# Patient Record
Sex: Female | Born: 1950 | Hispanic: No | Marital: Married | Smoking: Never smoker
Health system: Southern US, Community
[De-identification: ages and names within clinical notes are randomized; demographics above are authoritative.]

---

## 2005-03-03 HISTORY — PX: BREAST BIOPSY: SHX20

## 2005-03-21 ENCOUNTER — Other Ambulatory Visit: Admission: RE | Admit: 2005-03-21 | Discharge: 2005-03-21 | Payer: Self-pay | Admitting: Family Medicine

## 2005-04-09 ENCOUNTER — Encounter: Admission: RE | Admit: 2005-04-09 | Discharge: 2005-04-09 | Payer: Self-pay | Admitting: Family Medicine

## 2005-06-09 ENCOUNTER — Encounter: Admission: RE | Admit: 2005-06-09 | Discharge: 2005-06-09 | Payer: Self-pay | Admitting: Family Medicine

## 2005-06-16 ENCOUNTER — Encounter (INDEPENDENT_AMBULATORY_CARE_PROVIDER_SITE_OTHER): Payer: Self-pay | Admitting: *Deleted

## 2005-06-16 ENCOUNTER — Encounter: Admission: RE | Admit: 2005-06-16 | Discharge: 2005-06-16 | Payer: Self-pay | Admitting: Family Medicine

## 2006-06-24 ENCOUNTER — Encounter: Admission: RE | Admit: 2006-06-24 | Discharge: 2006-06-24 | Payer: Self-pay | Admitting: Internal Medicine

## 2007-06-28 ENCOUNTER — Encounter: Admission: RE | Admit: 2007-06-28 | Discharge: 2007-06-28 | Payer: Self-pay | Admitting: Family Medicine

## 2008-07-05 ENCOUNTER — Encounter: Admission: RE | Admit: 2008-07-05 | Discharge: 2008-07-05 | Payer: Self-pay | Admitting: Family Medicine

## 2009-07-10 ENCOUNTER — Encounter: Admission: RE | Admit: 2009-07-10 | Discharge: 2009-07-10 | Payer: Self-pay | Admitting: Family Medicine

## 2010-03-24 ENCOUNTER — Encounter: Payer: Self-pay | Admitting: Family Medicine

## 2010-07-25 ENCOUNTER — Other Ambulatory Visit (HOSPITAL_BASED_OUTPATIENT_CLINIC_OR_DEPARTMENT_OTHER): Payer: Self-pay | Admitting: Family Medicine

## 2010-07-25 DIAGNOSIS — Z1231 Encounter for screening mammogram for malignant neoplasm of breast: Secondary | ICD-10-CM

## 2010-07-31 ENCOUNTER — Ambulatory Visit (HOSPITAL_BASED_OUTPATIENT_CLINIC_OR_DEPARTMENT_OTHER)
Admission: RE | Admit: 2010-07-31 | Discharge: 2010-07-31 | Disposition: A | Discharge status: Home / Self Care | Payer: BC Managed Care – PPO | Source: Ambulatory Visit | Attending: Family Medicine | Admitting: Family Medicine

## 2010-07-31 DIAGNOSIS — Z1231 Encounter for screening mammogram for malignant neoplasm of breast: Secondary | ICD-10-CM | POA: Insufficient documentation

## 2011-07-21 ENCOUNTER — Other Ambulatory Visit: Payer: Self-pay | Admitting: Family Medicine

## 2011-07-21 DIAGNOSIS — Z1231 Encounter for screening mammogram for malignant neoplasm of breast: Secondary | ICD-10-CM

## 2011-08-05 ENCOUNTER — Ambulatory Visit
Admission: RE | Admit: 2011-08-05 | Discharge: 2011-08-05 | Disposition: A | Discharge status: Home / Self Care | Payer: BC Managed Care – PPO | Source: Ambulatory Visit | Attending: Family Medicine | Admitting: Family Medicine

## 2011-08-05 DIAGNOSIS — Z1231 Encounter for screening mammogram for malignant neoplasm of breast: Secondary | ICD-10-CM

## 2012-07-01 ENCOUNTER — Other Ambulatory Visit: Payer: Self-pay

## 2012-07-01 DIAGNOSIS — Z1231 Encounter for screening mammogram for malignant neoplasm of breast: Secondary | ICD-10-CM

## 2012-07-15 ENCOUNTER — Other Ambulatory Visit: Payer: Self-pay | Admitting: Family Medicine

## 2012-07-15 DIAGNOSIS — Z780 Asymptomatic menopausal state: Secondary | ICD-10-CM

## 2012-08-05 ENCOUNTER — Ambulatory Visit
Admission: RE | Admit: 2012-08-05 | Discharge: 2012-08-05 | Disposition: A | Discharge status: Home / Self Care | Payer: BC Managed Care – PPO | Source: Ambulatory Visit | Attending: Family Medicine | Admitting: Family Medicine

## 2012-08-05 ENCOUNTER — Ambulatory Visit
Admission: RE | Admit: 2012-08-05 | Discharge: 2012-08-05 | Disposition: A | Discharge status: Home / Self Care | Payer: BC Managed Care – PPO | Source: Ambulatory Visit

## 2012-08-05 DIAGNOSIS — Z1231 Encounter for screening mammogram for malignant neoplasm of breast: Secondary | ICD-10-CM

## 2012-08-05 DIAGNOSIS — Z780 Asymptomatic menopausal state: Secondary | ICD-10-CM

## 2012-08-20 ENCOUNTER — Ambulatory Visit: Payer: BC Managed Care – PPO

## 2013-06-27 ENCOUNTER — Other Ambulatory Visit (HOSPITAL_COMMUNITY)
Admission: RE | Admit: 2013-06-27 | Discharge: 2013-06-27 | Disposition: A | Discharge status: Home / Self Care | Payer: BC Managed Care – PPO | Source: Ambulatory Visit | Attending: Family Medicine | Admitting: Family Medicine

## 2013-06-27 ENCOUNTER — Other Ambulatory Visit: Payer: Self-pay | Admitting: Family Medicine

## 2013-06-27 DIAGNOSIS — Z124 Encounter for screening for malignant neoplasm of cervix: Secondary | ICD-10-CM | POA: Insufficient documentation

## 2013-06-27 DIAGNOSIS — Z1151 Encounter for screening for human papillomavirus (HPV): Secondary | ICD-10-CM | POA: Insufficient documentation

## 2013-08-05 ENCOUNTER — Other Ambulatory Visit: Payer: Self-pay

## 2013-08-05 DIAGNOSIS — Z1231 Encounter for screening mammogram for malignant neoplasm of breast: Secondary | ICD-10-CM

## 2013-08-09 ENCOUNTER — Ambulatory Visit
Admission: RE | Admit: 2013-08-09 | Discharge: 2013-08-09 | Disposition: A | Discharge status: Home / Self Care | Payer: BC Managed Care – PPO | Source: Ambulatory Visit

## 2013-08-09 ENCOUNTER — Encounter (INDEPENDENT_AMBULATORY_CARE_PROVIDER_SITE_OTHER): Payer: Self-pay

## 2013-08-09 DIAGNOSIS — Z1231 Encounter for screening mammogram for malignant neoplasm of breast: Secondary | ICD-10-CM

## 2014-07-04 ENCOUNTER — Other Ambulatory Visit: Payer: Self-pay

## 2014-07-04 DIAGNOSIS — Z1231 Encounter for screening mammogram for malignant neoplasm of breast: Secondary | ICD-10-CM

## 2014-08-18 ENCOUNTER — Ambulatory Visit: Payer: Self-pay

## 2014-08-22 ENCOUNTER — Ambulatory Visit
Admission: RE | Admit: 2014-08-22 | Discharge: 2014-08-22 | Disposition: A | Discharge status: Home / Self Care | Payer: BLUE CROSS/BLUE SHIELD | Source: Ambulatory Visit

## 2014-08-22 DIAGNOSIS — Z1231 Encounter for screening mammogram for malignant neoplasm of breast: Secondary | ICD-10-CM

## 2015-07-16 ENCOUNTER — Other Ambulatory Visit: Payer: Self-pay

## 2015-07-16 DIAGNOSIS — Z1231 Encounter for screening mammogram for malignant neoplasm of breast: Secondary | ICD-10-CM

## 2015-08-23 ENCOUNTER — Other Ambulatory Visit: Payer: Self-pay | Admitting: Family Medicine

## 2015-08-23 ENCOUNTER — Ambulatory Visit
Admission: RE | Admit: 2015-08-23 | Discharge: 2015-08-23 | Disposition: A | Discharge status: Home / Self Care | Payer: BLUE CROSS/BLUE SHIELD | Source: Ambulatory Visit

## 2015-08-23 DIAGNOSIS — Z1231 Encounter for screening mammogram for malignant neoplasm of breast: Secondary | ICD-10-CM

## 2015-08-27 ENCOUNTER — Other Ambulatory Visit: Payer: Self-pay | Admitting: Family Medicine

## 2015-08-27 DIAGNOSIS — R928 Other abnormal and inconclusive findings on diagnostic imaging of breast: Secondary | ICD-10-CM

## 2015-09-03 ENCOUNTER — Ambulatory Visit
Admission: RE | Admit: 2015-09-03 | Discharge: 2015-09-03 | Disposition: A | Discharge status: Home / Self Care | Payer: BLUE CROSS/BLUE SHIELD | Source: Ambulatory Visit | Attending: Family Medicine | Admitting: Family Medicine

## 2015-09-03 DIAGNOSIS — R928 Other abnormal and inconclusive findings on diagnostic imaging of breast: Secondary | ICD-10-CM

## 2016-01-31 ENCOUNTER — Other Ambulatory Visit: Payer: Self-pay | Admitting: Family Medicine

## 2016-01-31 DIAGNOSIS — N6310 Unspecified lump in the right breast, unspecified quadrant: Secondary | ICD-10-CM

## 2016-03-14 ENCOUNTER — Other Ambulatory Visit: Payer: BLUE CROSS/BLUE SHIELD

## 2016-03-19 ENCOUNTER — Other Ambulatory Visit: Payer: BLUE CROSS/BLUE SHIELD

## 2016-03-24 ENCOUNTER — Ambulatory Visit
Admission: RE | Admit: 2016-03-24 | Discharge: 2016-03-24 | Disposition: A | Discharge status: Home / Self Care | Payer: BLUE CROSS/BLUE SHIELD | Source: Ambulatory Visit | Attending: Family Medicine | Admitting: Family Medicine

## 2016-03-24 DIAGNOSIS — N6310 Unspecified lump in the right breast, unspecified quadrant: Secondary | ICD-10-CM

## 2016-07-17 ENCOUNTER — Other Ambulatory Visit: Payer: Self-pay | Admitting: Family Medicine

## 2016-07-17 ENCOUNTER — Other Ambulatory Visit (HOSPITAL_COMMUNITY)
Admission: RE | Admit: 2016-07-17 | Discharge: 2016-07-17 | Disposition: A | Discharge status: Home / Self Care | Payer: BLUE CROSS/BLUE SHIELD | Source: Ambulatory Visit | Attending: Family Medicine | Admitting: Family Medicine

## 2016-07-17 DIAGNOSIS — Z01411 Encounter for gynecological examination (general) (routine) with abnormal findings: Secondary | ICD-10-CM | POA: Diagnosis present

## 2016-07-18 LAB — CYTOLOGY - PAP: Diagnosis: NEGATIVE

## 2016-08-18 ENCOUNTER — Other Ambulatory Visit: Payer: Self-pay | Admitting: Family Medicine

## 2016-08-18 DIAGNOSIS — Z1231 Encounter for screening mammogram for malignant neoplasm of breast: Secondary | ICD-10-CM

## 2016-09-02 ENCOUNTER — Ambulatory Visit
Admission: RE | Admit: 2016-09-02 | Discharge: 2016-09-02 | Disposition: A | Discharge status: Home / Self Care | Payer: BLUE CROSS/BLUE SHIELD | Source: Ambulatory Visit | Attending: Family Medicine | Admitting: Family Medicine

## 2016-09-02 ENCOUNTER — Other Ambulatory Visit: Payer: Self-pay | Admitting: Family Medicine

## 2016-09-02 DIAGNOSIS — Z1231 Encounter for screening mammogram for malignant neoplasm of breast: Secondary | ICD-10-CM

## 2016-09-02 DIAGNOSIS — N6001 Solitary cyst of right breast: Secondary | ICD-10-CM

## 2016-09-10 ENCOUNTER — Ambulatory Visit
Admission: RE | Admit: 2016-09-10 | Discharge: 2016-09-10 | Disposition: A | Discharge status: Home / Self Care | Payer: BLUE CROSS/BLUE SHIELD | Source: Ambulatory Visit | Attending: Family Medicine | Admitting: Family Medicine

## 2016-09-10 ENCOUNTER — Other Ambulatory Visit: Payer: Self-pay | Admitting: Family Medicine

## 2016-09-10 DIAGNOSIS — N6001 Solitary cyst of right breast: Secondary | ICD-10-CM

## 2017-08-07 ENCOUNTER — Other Ambulatory Visit: Payer: Self-pay | Admitting: Family Medicine

## 2017-08-07 DIAGNOSIS — Z1231 Encounter for screening mammogram for malignant neoplasm of breast: Secondary | ICD-10-CM

## 2017-09-11 ENCOUNTER — Ambulatory Visit
Admission: RE | Admit: 2017-09-11 | Discharge: 2017-09-11 | Disposition: A | Discharge status: Home / Self Care | Payer: BLUE CROSS/BLUE SHIELD | Source: Ambulatory Visit | Attending: Family Medicine | Admitting: Family Medicine

## 2017-09-11 DIAGNOSIS — Z1231 Encounter for screening mammogram for malignant neoplasm of breast: Secondary | ICD-10-CM

## 2018-07-12 ENCOUNTER — Emergency Department (HOSPITAL_COMMUNITY)
Admission: EM | Admit: 2018-07-12 | Discharge: 2018-07-12 | Disposition: A | Discharge status: Home / Self Care | Payer: Medicare HMO | Attending: Emergency Medicine | Admitting: Emergency Medicine

## 2018-07-12 ENCOUNTER — Emergency Department (HOSPITAL_COMMUNITY): Payer: Medicare HMO

## 2018-07-12 ENCOUNTER — Encounter (HOSPITAL_COMMUNITY): Payer: Self-pay | Admitting: Emergency Medicine

## 2018-07-12 ENCOUNTER — Other Ambulatory Visit: Payer: Self-pay

## 2018-07-12 DIAGNOSIS — R531 Weakness: Secondary | ICD-10-CM | POA: Diagnosis not present

## 2018-07-12 DIAGNOSIS — R5383 Other fatigue: Secondary | ICD-10-CM | POA: Diagnosis not present

## 2018-07-12 DIAGNOSIS — M7918 Myalgia, other site: Secondary | ICD-10-CM | POA: Diagnosis not present

## 2018-07-12 DIAGNOSIS — I10 Essential (primary) hypertension: Secondary | ICD-10-CM | POA: Diagnosis not present

## 2018-07-12 DIAGNOSIS — R110 Nausea: Secondary | ICD-10-CM

## 2018-07-12 DIAGNOSIS — R05 Cough: Secondary | ICD-10-CM | POA: Insufficient documentation

## 2018-07-12 LAB — BASIC METABOLIC PANEL
Anion gap: 13 (ref 5–15)
BUN: 16 mg/dL (ref 8–23)
CO2: 22 mmol/L (ref 22–32)
Calcium: 9.1 mg/dL (ref 8.9–10.3)
Chloride: 100 mmol/L (ref 98–111)
Creatinine, Ser: 1.04 mg/dL — ABNORMAL HIGH (ref 0.44–1.00)
GFR calc Af Amer: >60 mL/min (ref >60)
GFR calc non Af Amer: 56 mL/min — ABNORMAL LOW (ref >60)
Glucose, Bld: 150 mg/dL — ABNORMAL HIGH (ref 70–99)
Potassium: 6 mmol/L — ABNORMAL HIGH (ref 3.5–5.1)
Sodium: 135 mmol/L (ref 135–145)

## 2018-07-12 LAB — CBC
HCT: 41.8 % (ref 36.0–46.0)
Hemoglobin: 14 g/dL (ref 12.0–15.0)
MCH: 31 pg (ref 26.0–34.0)
MCHC: 33.5 g/dL (ref 30.0–36.0)
MCV: 92.7 fL (ref 80.0–100.0)
Platelets: 400 10*3/uL (ref 150–400)
RBC: 4.51 MIL/uL (ref 3.87–5.11)
RDW: 12.3 % (ref 11.5–15.5)
WBC: 11 10*3/uL — ABNORMAL HIGH (ref 4.0–10.5)
nRBC: 0 % (ref 0.0–0.2)

## 2018-07-12 LAB — T4, FREE: Free T4: 1.24 ng/dL (ref 0.82–1.77)

## 2018-07-12 LAB — COMPREHENSIVE METABOLIC PANEL
ALT: 75 U/L — ABNORMAL HIGH (ref 0–44)
AST: 98 U/L — ABNORMAL HIGH (ref 15–41)
Albumin: 2.5 g/dL — ABNORMAL LOW (ref 3.5–5.0)
Alkaline Phosphatase: 68 U/L (ref 38–126)
Anion gap: 11 (ref 5–15)
BUN: 14 mg/dL (ref 8–23)
CO2: 22 mmol/L (ref 22–32)
Calcium: 8.1 mg/dL — ABNORMAL LOW (ref 8.9–10.3)
Chloride: 103 mmol/L (ref 98–111)
Creatinine, Ser: 0.93 mg/dL (ref 0.44–1.00)
GFR calc Af Amer: >60 mL/min (ref >60)
GFR calc non Af Amer: >60 mL/min (ref >60)
Glucose, Bld: 137 mg/dL — ABNORMAL HIGH (ref 70–99)
Potassium: 4.5 mmol/L (ref 3.5–5.1)
Sodium: 136 mmol/L (ref 135–145)
Total Bilirubin: 0.7 mg/dL (ref 0.3–1.2)
Total Protein: 6.3 g/dL — ABNORMAL LOW (ref 6.5–8.1)

## 2018-07-12 LAB — TSH: TSH: 0.696 u[IU]/mL (ref 0.350–4.500)

## 2018-07-12 MED ORDER — ONDANSETRON 4 MG PO TBDP
4.0000 mg | ORAL_TABLET | Freq: Once | ORAL | Status: AC
  Administered 2018-07-12: 4 mg via ORAL
  Filled 2018-07-12: qty 1

## 2018-07-12 MED ORDER — ONDANSETRON HCL 4 MG PO TABS: 4.0000 mg | ORAL_TABLET | Freq: Three times a day (TID) | ORAL | 0 refills | Status: AC | PRN

## 2018-07-12 MED ORDER — SODIUM CHLORIDE 0.9 % IV BOLUS
1000.0000 mL | Freq: Once | INTRAVENOUS | Status: AC
  Administered 2018-07-12: 1000 mL via INTRAVENOUS

## 2018-07-12 NOTE — ED Provider Notes (Signed)
I saw and evaluated the patient, reviewed the resident's note and I agree with the findings and plan with the following exceptions.   Patient is here with nonspecific symptoms of generalized weakness and nausea without vomiting.  No pain.  States is been going on for a few days now.  No fevers.  No history of the same. On my exam her abdomen is benign, vital signs within normal limits, normal skin tone, lungs are clear and heart regular rate and rhythm.  She has active bowel sounds. Unclear etiology for symptoms her potassium seems to be hemolyzed we will recheck that.  Will add on EKG TSH give her some fluids and some nausea medicine and see if she feels better.  Agree with check an EKG to make sure is not cardiac related.  Could consider reflux however she does not have any burning or chest pain to corroborate that.  Consider gallbladder but if her liver enzymes and bilirubin are normal I think is less likely.  Could be just a viral illness that she is getting over as well but she does not appear acutely ill I think she will likely be able be discharged.   EKG Interpretation  Date/Time:    Ventricular Rate:    PR Interval:    QRS Duration:   QT Interval:    QTC Calculation:   R Axis:     Text Interpretation:           Marily Memos, MD 07/12/18 1919

## 2018-07-12 NOTE — ED Notes (Signed)
Call Rico Ala for updates please [phone]

## 2018-07-12 NOTE — ED Provider Notes (Signed)
[facility] - [facility] Provider Note   CSN: [id number] Arrival date & time: 07/12/18  1335    History   Chief Complaint Chief Complaint  Patient presents with   Weakness   Emesis   Generalized Body Aches    HPI Tara Davidson is a 68 y.o. female with a PMHx of HTN, HLD who presents to the ED with fatigue and not feeling well for the past week. States her husband came down sick 11 days ago and was hospitalized for his ESRD and then developed a fever while in the hospital (he is still hospitalized).  States shortly after she started not feeling well and has had a decreased appetite. Also endorsing a cough and nausea. Denies any Fevers, SOB, CP, Abdominal pain, urinary symptoms, diarrhea or constipation      HPI  History reviewed. No pertinent past medical history.  There are no active problems to display for this patient.   History reviewed. No pertinent surgical history.   OB History   No obstetric history on file.      Home Medications    Prior to Admission medications   Medication Sig Start Date End Date Taking? Authorizing Provider  ondansetron (ZOFRAN) 4 MG tablet Take 1 tablet (4 mg total) by mouth every 8 (eight) hours as needed for nausea or vomiting. 07/12/18   Tara Doe, MD    Family History Family History  Problem Relation Age of Onset   Breast cancer Neg Hx     Social History Social History   Tobacco Use   Smoking status: Never Smoker   Smokeless tobacco: Never Used  Substance Use Topics   Alcohol use: Never    Frequency: Never   Drug use: Never     Allergies   Patient has no known allergies.   Review of Systems Review of Systems  Constitutional: Positive for activity change, appetite change and fatigue. Negative for chills, diaphoresis and fever.  Respiratory: Positive for cough. Negative for chest tightness and shortness of breath.   Cardiovascular: Negative for chest pain.    Gastrointestinal: Positive for nausea. Negative for abdominal pain, constipation, diarrhea and vomiting.  Genitourinary: Negative for difficulty urinating and dysuria.  Musculoskeletal: Negative for back pain and neck pain.  Skin: Negative for rash and wound.  Neurological: Positive for weakness (generalized). Negative for dizziness, seizures, facial asymmetry, speech difficulty, light-headedness, numbness and headaches.  Psychiatric/Behavioral: Negative for confusion.  All other systems reviewed and are negative.    Physical Exam Updated Vital Signs BP (!) 118/97    Pulse 80    Temp 98.6 F (37 C) (Oral)    Resp 16    Wt 52.6 kg    SpO2 97%   Physical Exam Vitals signs and nursing note reviewed.  Constitutional:      General: She is not in acute distress.    Appearance: Normal appearance. She is well-developed.  HENT:     Head: Normocephalic and atraumatic.  Eyes:     Conjunctiva/sclera: Conjunctivae normal.  Neck:     Musculoskeletal: Neck supple.  Cardiovascular:     Rate and Rhythm: Normal rate and regular rhythm.     Heart sounds: No murmur.  Pulmonary:     Effort: Pulmonary effort is normal. No respiratory distress.     Breath sounds: Normal breath sounds. No wheezing, rhonchi or rales.  Abdominal:     Palpations: Abdomen is soft.     Tenderness: There is no abdominal tenderness. There is no  guarding.  Musculoskeletal:     Right lower leg: No edema.     Left lower leg: No edema.  Skin:    General: Skin is warm and dry.  Neurological:     General: No focal deficit present.     Mental Status: She is alert and oriented to person, place, and time.     Cranial Nerves: No cranial nerve deficit.     Sensory: No sensory deficit.     Motor: No weakness.      ED Treatments / Results  Labs (all labs ordered are listed, but only abnormal results are displayed) Labs Reviewed  BASIC METABOLIC PANEL - Abnormal; Notable for the following components:      Result Value    Potassium 6.0 (*)    Glucose, Bld 150 (*)    Creatinine, Ser 1.04 (*)    GFR calc non Af Amer 56 (*)    All other components within normal limits  CBC - Abnormal; Notable for the following components:   WBC 11.0 (*)    All other components within normal limits  COMPREHENSIVE METABOLIC PANEL - Abnormal; Notable for the following components:   Glucose, Bld 137 (*)    Calcium 8.1 (*)    Total Protein 6.3 (*)    Albumin 2.5 (*)    AST 98 (*)    ALT 75 (*)    All other components within normal limits  TSH  T4, FREE    EKG None  Radiology Dg Chest Portable 1 View  Result Date: 07/12/2018 CLINICAL DATA:  Cough.  Intermittent fever. EXAM: PORTABLE CHEST 1 VIEW COMPARISON:  None. FINDINGS: The heart size and pulmonary vascularity are normal. Aortic atherosclerosis. Small areas of atelectasis at both lung bases. No consolidative infiltrates or effusions. No bone abnormality. IMPRESSION: Tiny areas of atelectasis at both lung bases. Aortic Atherosclerosis (ICD10-I70.0). Electronically Signed   By: Francene BoyersJames  Maxwell M.D.   On: 07/12/2018 15:38    Procedures Procedures (including critical care time)  Medications Ordered in ED Medications  ondansetron (ZOFRAN-ODT) disintegrating tablet 4 mg (4 mg Oral Given 07/12/18 1552)  sodium chloride 0.9 % bolus 1,000 mL (0 mLs Intravenous Stopped 07/12/18 1822)     Initial Impression / Assessment and Plan / ED Course  I have reviewed the triage vital signs and the nursing notes.  Pertinent labs & imaging results that were available during my care of the patient were reviewed by me and considered in my medical decision making (see chart for details).        Andi HenceMaria Lourdes Davidson is a 68 y.o. female with a PMHx of HTN, HLD who presents to the ED with fatigue and not feeling well for the past week. States her husband came down sick 11 days ago and was hospitalized for his ESRD and then developed a fever while in the hospital (he is still  hospitalized).  States shortly after she started not feeling well and has had a decreased appetite. Also endorsing a cough and nausea. Denies any Fevers, SOB, CP, Abdominal pain, urinary symptoms, diarrhea or constipation    On initial exam patient is well appearing although appears uncomfortable, not in acute distress. VSS. Afebrile. physical exam as above shows a non-focal neuro exam.     ED interpretation of Labs: CBC unremarkable, no anemia. BMP showed a hemolyzed K 6.0 (repeat 4.5), otherwise unremarkable. TSH normal. LFT's slightly elevated (discussed this with patient and advised to follow up with PCP) ED interpretation of EKG: NSR  with no evidence of ischemia or conduction abnormalities. ED interpretation of Imaging: CXR with not acute process. No PNA.   Medications given in ED: Zofran ODT. 1L IVF  On reassessment patient with improvement in symptoms. States she is feeling much better.  Fatigue likely secondary to acute viral illness vs other non-emergent illness. Her electrolytes, thyroids function and Hgb were all unremarkable. Patient has no focal weakness therefore do not feel like head CT or further imagine is required. Advised patient to follow up with her PCP in which patient states she will do   Discharge instructions: I explained the diagnosis and have given explicit precautions to return to the ER  for any other new or worsening symptoms. Patient is in understanding of plan. All questions answered at this time.  Discharge instructions concerning home care and prescriptions have been given.  The patient is STABLE and is discharged to home in good condition.     Final Clinical Impressions(s) / ED Diagnoses   Final diagnoses:  Other fatigue  Nausea    ED Discharge Orders         Ordered    ondansetron (ZOFRAN) 4 MG tablet  Every 8 hours PRN     07/12/18 1852           Tara Doe, MD 07/12/18 Lucienne Minks, MD 07/12/18 1919

## 2018-07-12 NOTE — ED Notes (Signed)
Zollie Scale will pick pt up if/when pt is discharged  [phone]

## 2018-07-12 NOTE — ED Triage Notes (Addendum)
Pt in from home with c/o generalized weakness, appetite loss and emesis since 5/1. Reports intermittent fevers, temp 98.6. she works in a [employer], possible exposure to [employer]. Reports some sob, mild cough

## 2018-07-16 DIAGNOSIS — J189 Pneumonia, unspecified organism: Secondary | ICD-10-CM | POA: Diagnosis not present

## 2018-07-19 DIAGNOSIS — R05 Cough: Secondary | ICD-10-CM | POA: Diagnosis not present

## 2018-08-11 ENCOUNTER — Other Ambulatory Visit: Payer: Self-pay | Admitting: Family Medicine

## 2018-08-11 DIAGNOSIS — Z1231 Encounter for screening mammogram for malignant neoplasm of breast: Secondary | ICD-10-CM

## 2018-09-14 ENCOUNTER — Other Ambulatory Visit: Payer: Self-pay

## 2018-09-14 ENCOUNTER — Ambulatory Visit
Admission: RE | Admit: 2018-09-14 | Discharge: 2018-09-14 | Disposition: A | Discharge status: Home / Self Care | Payer: Medicare HMO | Source: Ambulatory Visit | Attending: Family Medicine | Admitting: Family Medicine

## 2018-09-14 DIAGNOSIS — Z1231 Encounter for screening mammogram for malignant neoplasm of breast: Secondary | ICD-10-CM

## 2018-10-05 DIAGNOSIS — I10 Essential (primary) hypertension: Secondary | ICD-10-CM | POA: Diagnosis not present

## 2018-10-05 DIAGNOSIS — E7800 Pure hypercholesterolemia, unspecified: Secondary | ICD-10-CM | POA: Diagnosis not present

## 2018-10-05 DIAGNOSIS — E559 Vitamin D deficiency, unspecified: Secondary | ICD-10-CM | POA: Diagnosis not present

## 2018-10-05 DIAGNOSIS — K219 Gastro-esophageal reflux disease without esophagitis: Secondary | ICD-10-CM | POA: Diagnosis not present

## 2018-10-05 DIAGNOSIS — J45909 Unspecified asthma, uncomplicated: Secondary | ICD-10-CM | POA: Diagnosis not present

## 2018-10-05 DIAGNOSIS — Z79899 Other long term (current) drug therapy: Secondary | ICD-10-CM | POA: Diagnosis not present

## 2018-10-05 DIAGNOSIS — L309 Dermatitis, unspecified: Secondary | ICD-10-CM | POA: Diagnosis not present

## 2018-10-05 DIAGNOSIS — Z1211 Encounter for screening for malignant neoplasm of colon: Secondary | ICD-10-CM | POA: Diagnosis not present

## 2018-10-05 DIAGNOSIS — Z0000 Encounter for general adult medical examination without abnormal findings: Secondary | ICD-10-CM | POA: Diagnosis not present

## 2018-11-10 DIAGNOSIS — R69 Illness, unspecified: Secondary | ICD-10-CM | POA: Diagnosis not present

## 2020-09-30 IMAGING — MG DIGITAL SCREENING BILATERAL MAMMOGRAM WITH TOMO AND CAD
8 series · 9 of 24 positions shown · non-contrast
Comparison: Previous exam(s).

CLINICAL DATA: Screening.

EXAM:
DIGITAL SCREENING BILATERAL MAMMOGRAM WITH TOMO AND CAD

[L MLO synth-2D]
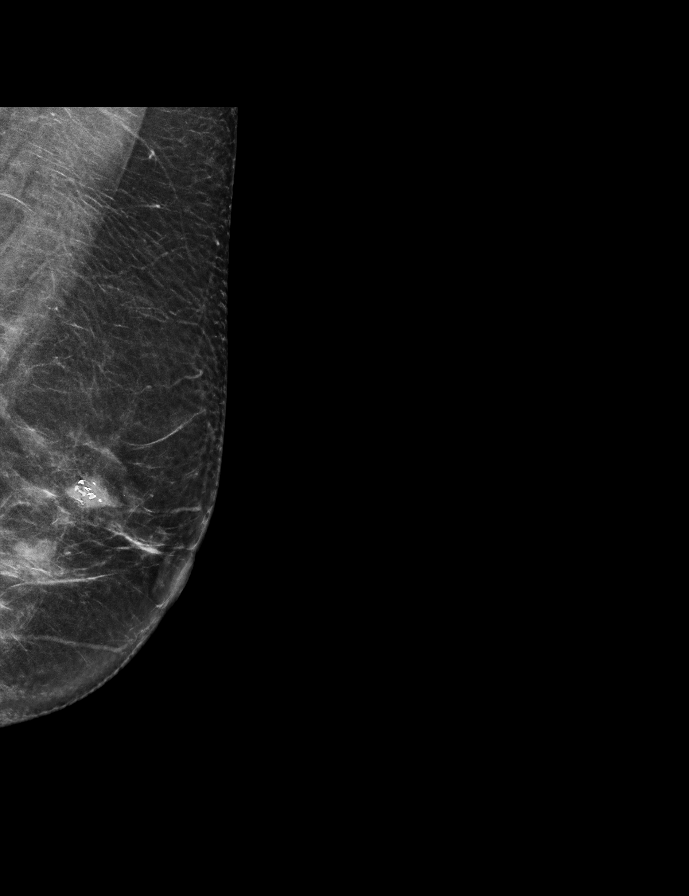

[R CC synth-2D]
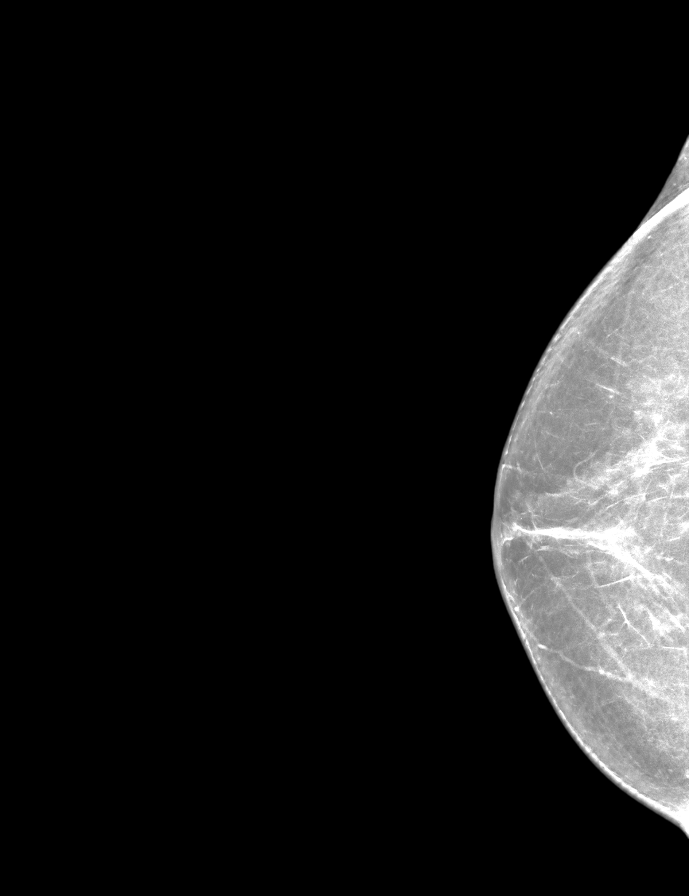

[L CC synth-2D]
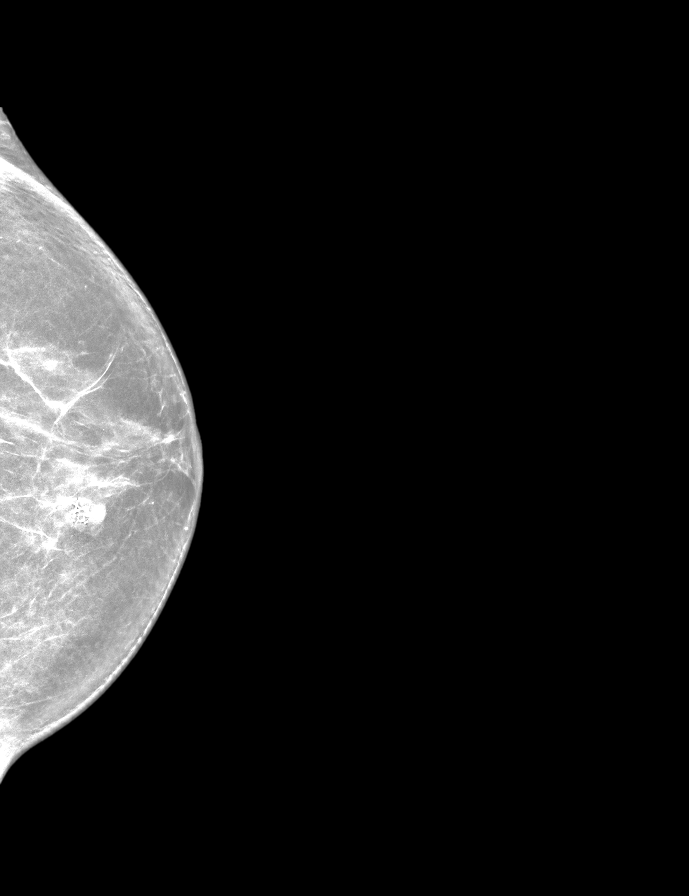

[R MLO synth-2D]
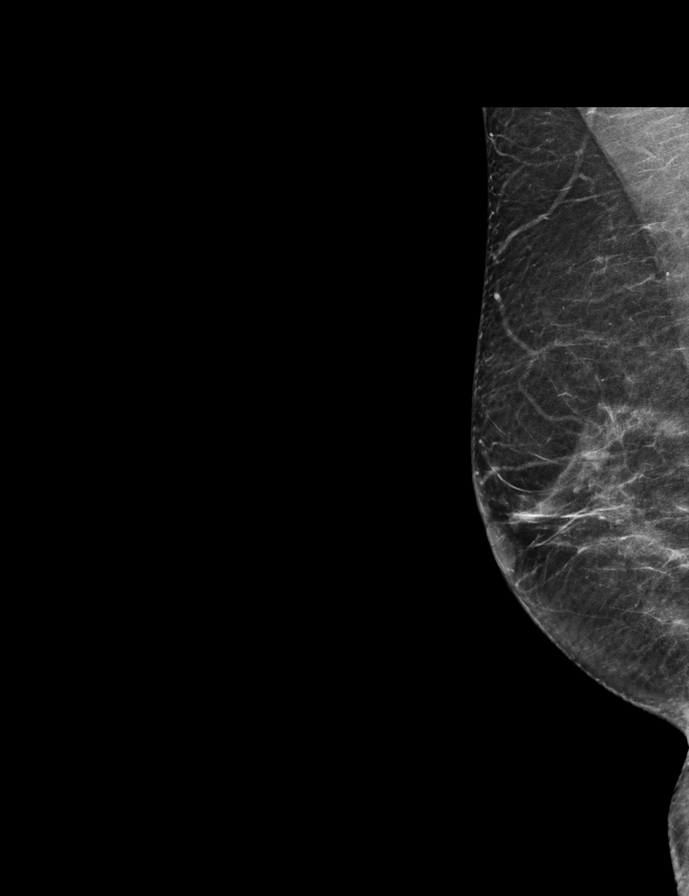

[R CC tomo · 2 of 64 frames shown]
[frame 21/64]
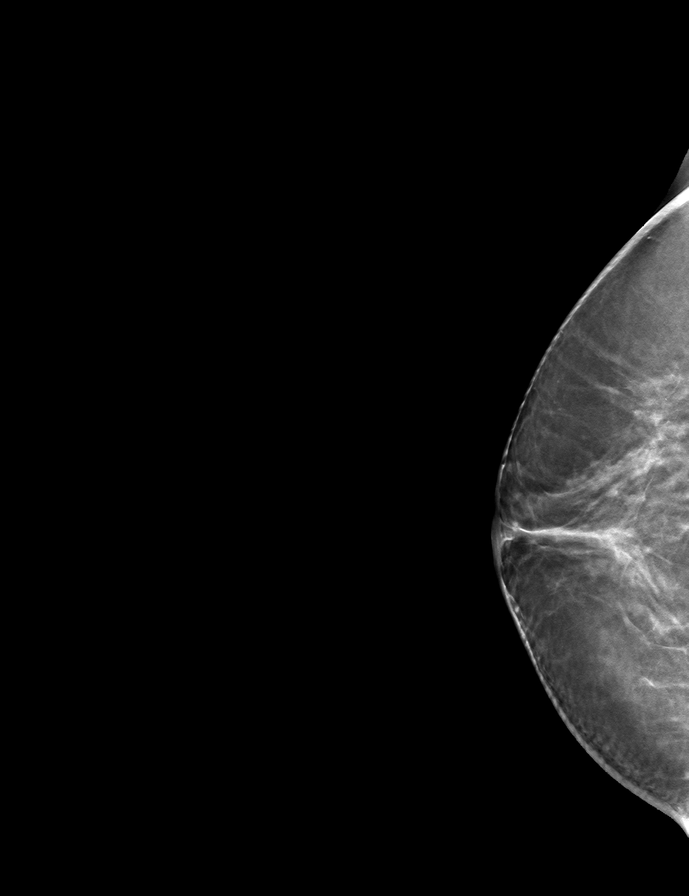
[frame 33/64]
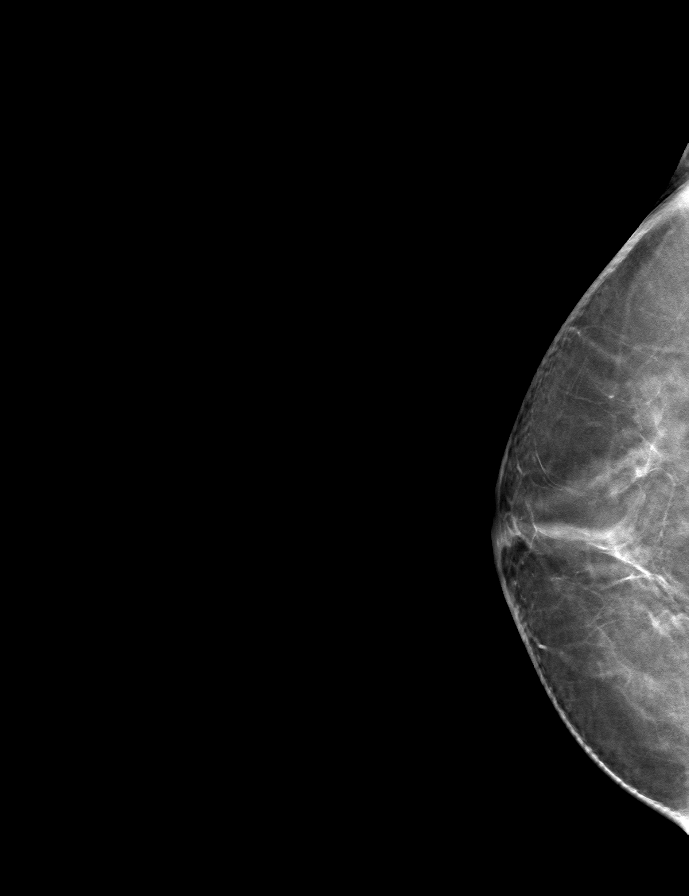

[L CC tomo · tomo slice 31/62.0]
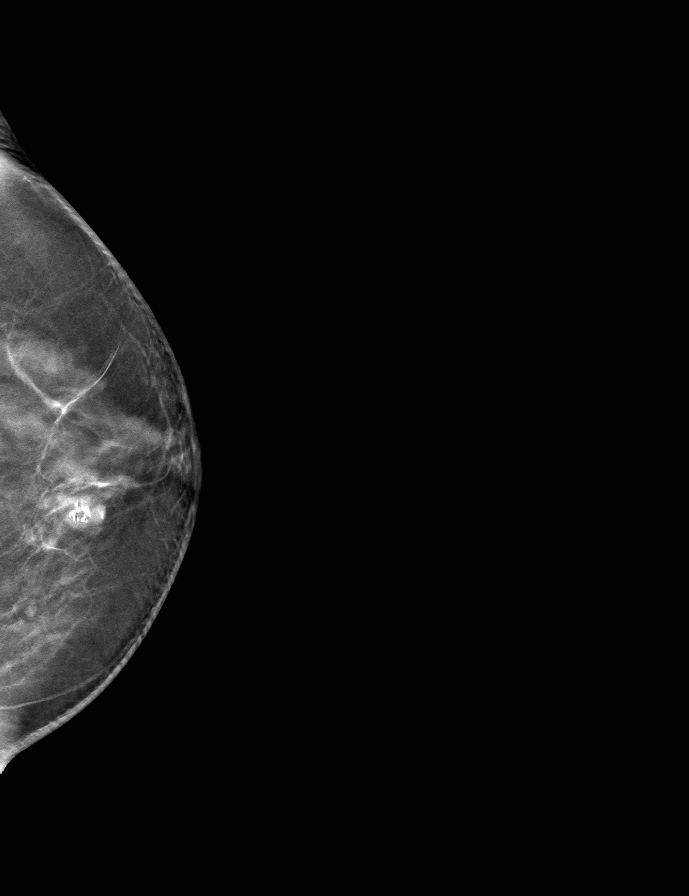

[R MLO tomo · tomo slice 29/58.0]
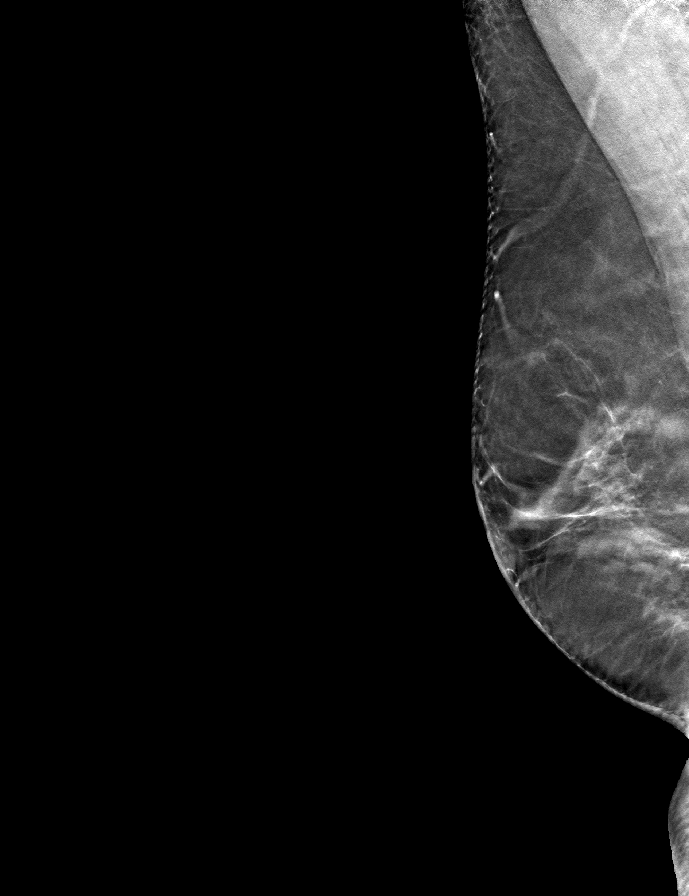

[L MLO tomo · tomo slice 32/63.0]
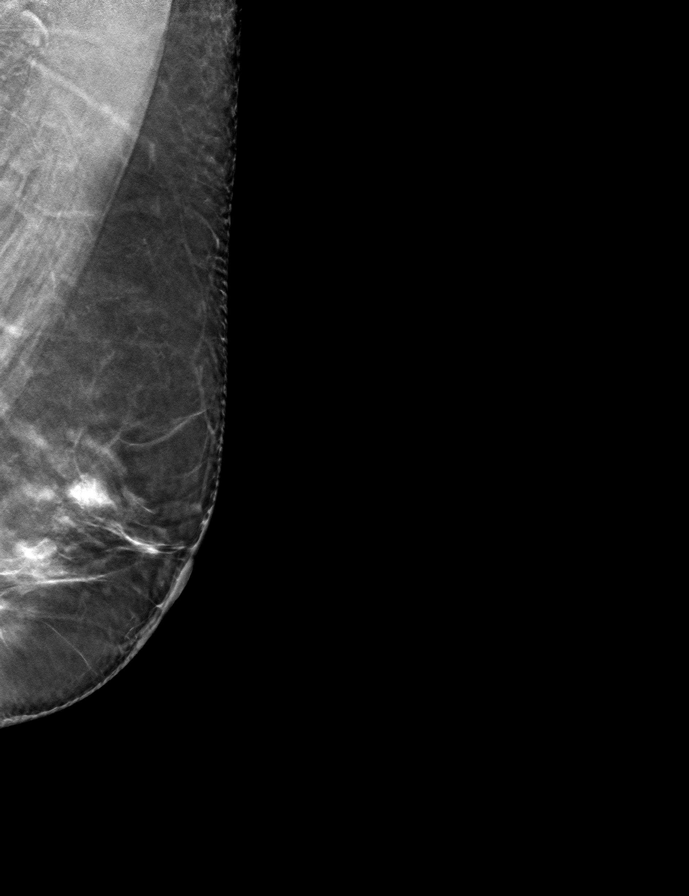

[9 of 24 positions shown; findings below may reference images not displayed]

ACR Breast Density Category b: There are scattered areas of
fibroglandular density.
FINDINGS: There are no findings suspicious for malignancy. Images were
processed with CAD.
IMPRESSION: No mammographic evidence of malignancy. A result letter of this
screening mammogram will be mailed directly to the patient.

RECOMMENDATION:
Screening mammogram in one year. (Code:[id number])

BI-RADS CATEGORY  1: Negative.
